# Patient Record
Sex: Female | Born: 1970 | Race: Black or African American | Hispanic: No | Marital: Single | Smoking: Never smoker
Health system: Southern US, Community
[De-identification: ages and names within clinical notes are randomized; demographics above are authoritative.]

## PROBLEM LIST (undated history)

## (undated) HISTORY — PX: TUBAL LIGATION: SHX77

---

## 2004-08-23 ENCOUNTER — Emergency Department: Payer: Self-pay | Admitting: Internal Medicine

## 2004-09-15 ENCOUNTER — Emergency Department: Payer: Self-pay | Admitting: Emergency Medicine

## 2004-10-23 ENCOUNTER — Emergency Department: Payer: Self-pay | Admitting: Emergency Medicine

## 2006-05-20 ENCOUNTER — Emergency Department: Payer: Self-pay | Admitting: Internal Medicine

## 2006-11-06 ENCOUNTER — Emergency Department: Payer: Self-pay | Admitting: Unknown Physician Specialty

## 2006-12-11 ENCOUNTER — Emergency Department: Payer: Self-pay | Admitting: Emergency Medicine

## 2007-06-29 ENCOUNTER — Emergency Department: Payer: Self-pay | Admitting: Emergency Medicine

## 2008-11-17 ENCOUNTER — Emergency Department: Payer: Self-pay | Admitting: Unknown Physician Specialty

## 2010-08-22 ENCOUNTER — Emergency Department: Payer: Self-pay | Admitting: Emergency Medicine

## 2011-02-28 ENCOUNTER — Emergency Department: Payer: Self-pay | Admitting: *Deleted

## 2013-12-03 ENCOUNTER — Emergency Department: Payer: Self-pay | Admitting: Emergency Medicine

## 2014-10-01 ENCOUNTER — Emergency Department: Payer: Self-pay | Admitting: Emergency Medicine

## 2015-02-07 IMAGING — CR DG WRIST COMPLETE 3+V*R*
1 series · 4 of 4 positions shown · non-contrast
Comparison: None.

CLINICAL DATA: Fall right wrist pain

EXAM:
RIGHT WRIST - COMPLETE 3+ VIEW

[Series 1: x wrist pa right · 0.14mm/px · 4 of 4 slices shown]
[im 1/4]
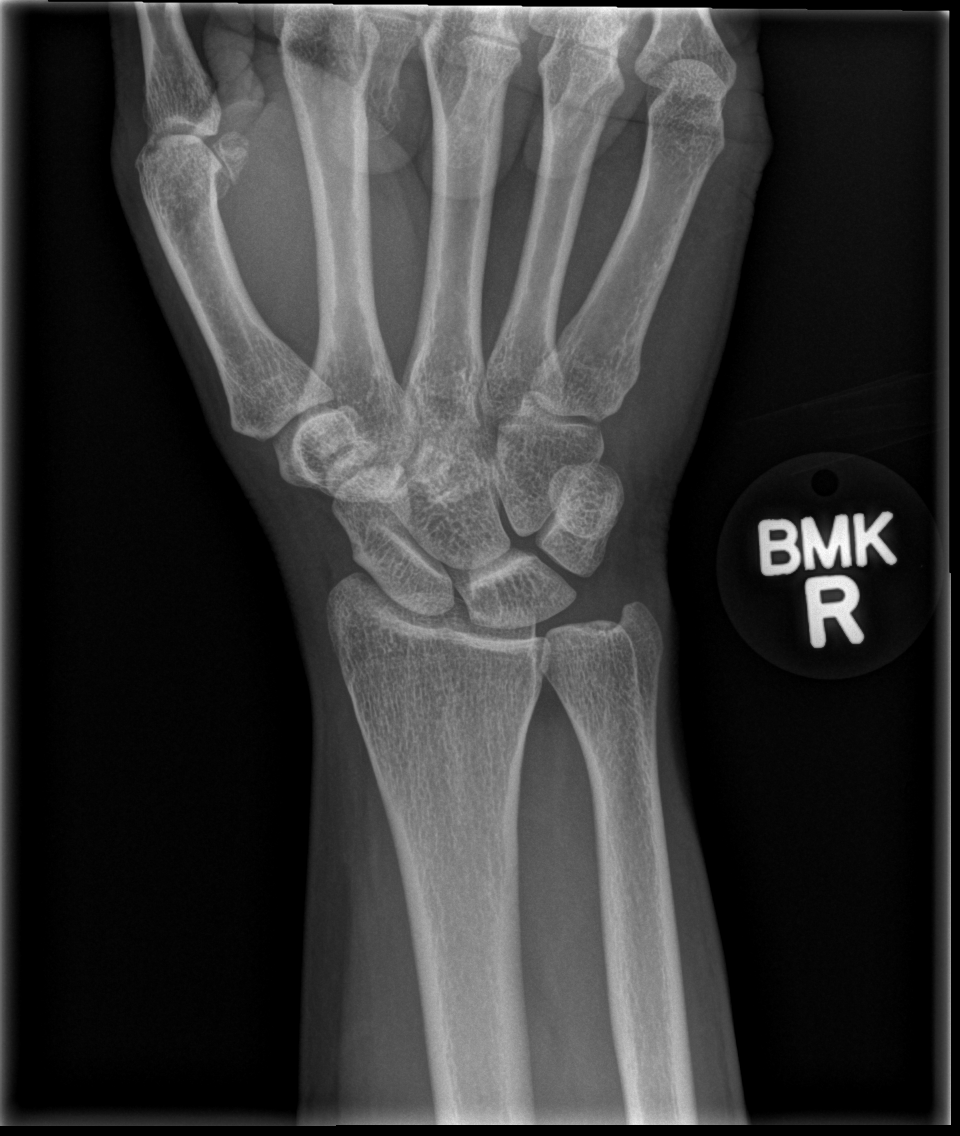
[im 2/4]
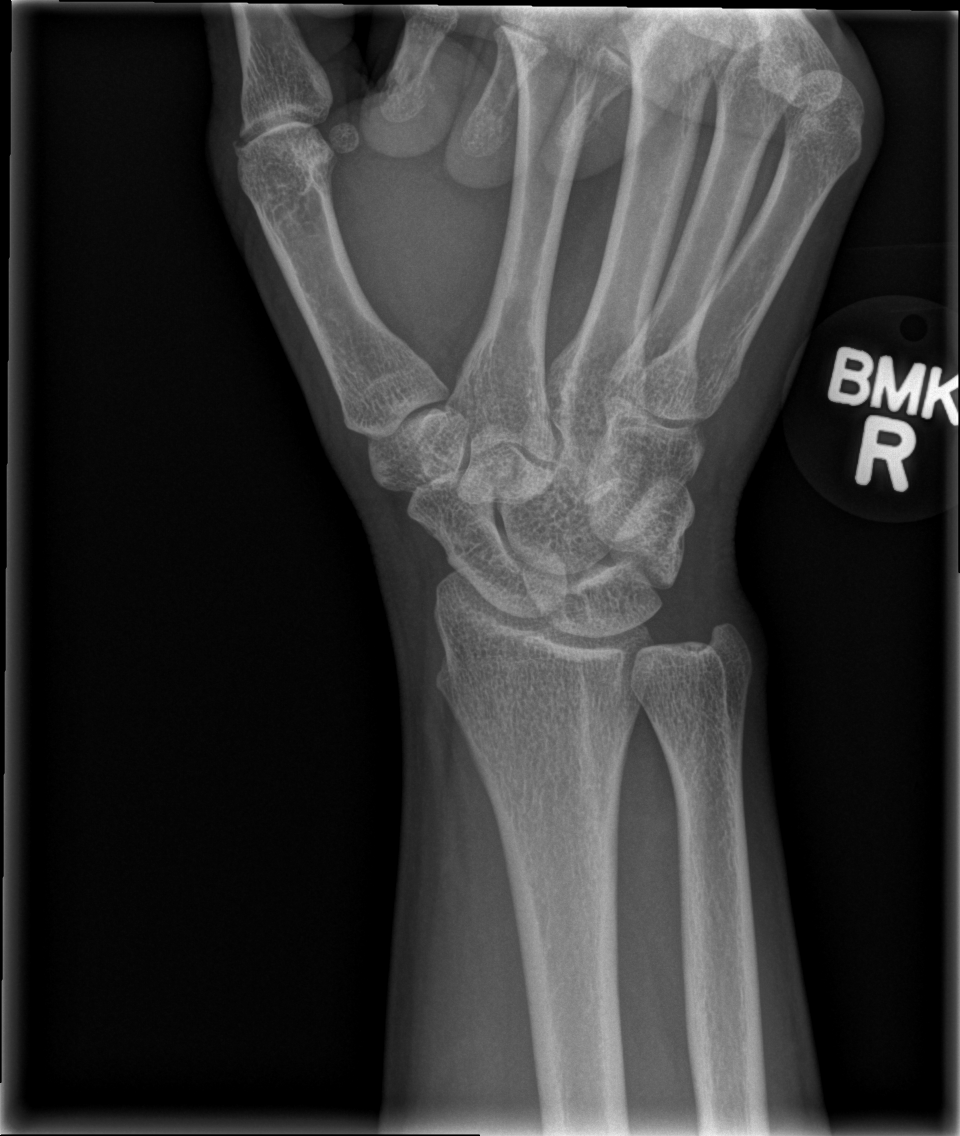
[im 3/4]
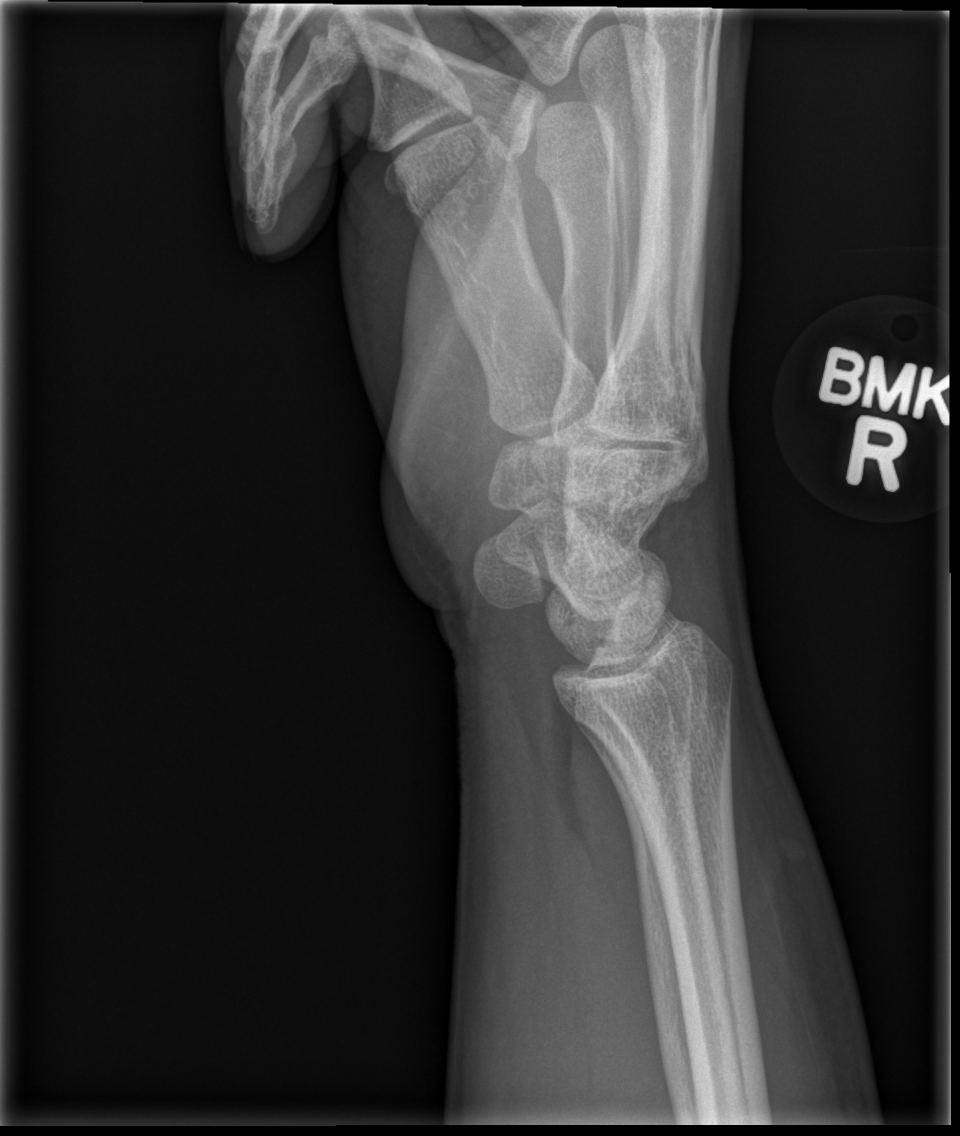
[im 4/4]
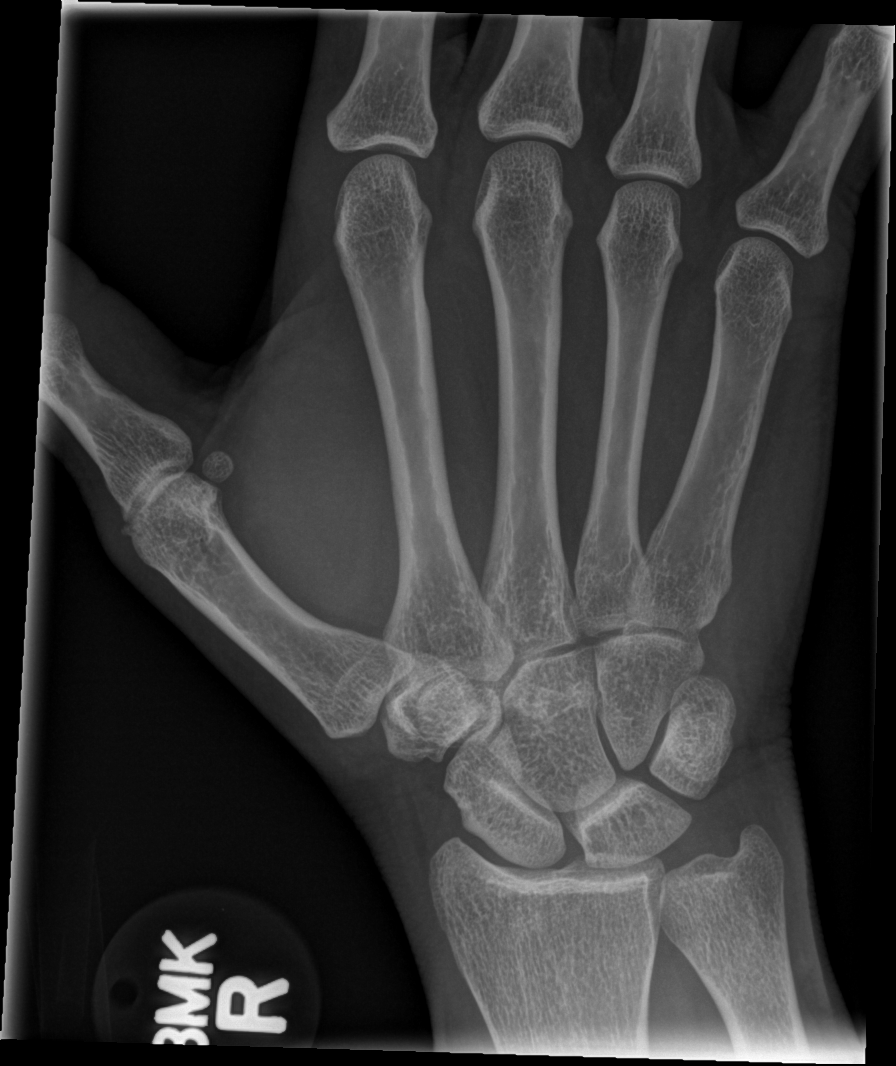

[4 of 4 positions shown; findings below may reference images not displayed]

FINDINGS: There is no fracture or dislocation. Incidental note is made of a
tiny cyst in the lunate. There is evidence of soft tissue swelling
along the volar surface of the wrist.
IMPRESSION: No acute abnormalities other than mild soft tissue swelling.

## 2016-01-28 ENCOUNTER — Emergency Department
Admission: EM | Admit: 2016-01-28 | Discharge: 2016-01-28 | Disposition: A | Discharge status: Home / Self Care | Payer: No Typology Code available for payment source | Attending: Emergency Medicine | Admitting: Emergency Medicine

## 2016-01-28 ENCOUNTER — Emergency Department: Payer: No Typology Code available for payment source

## 2016-01-28 ENCOUNTER — Encounter: Payer: Self-pay | Admitting: Emergency Medicine

## 2016-01-28 DIAGNOSIS — Y939 Activity, unspecified: Secondary | ICD-10-CM | POA: Insufficient documentation

## 2016-01-28 DIAGNOSIS — S199XXA Unspecified injury of neck, initial encounter: Secondary | ICD-10-CM | POA: Diagnosis present

## 2016-01-28 DIAGNOSIS — S93601A Unspecified sprain of right foot, initial encounter: Secondary | ICD-10-CM | POA: Insufficient documentation

## 2016-01-28 DIAGNOSIS — Y92410 Unspecified street and highway as the place of occurrence of the external cause: Secondary | ICD-10-CM | POA: Diagnosis not present

## 2016-01-28 DIAGNOSIS — Y999 Unspecified external cause status: Secondary | ICD-10-CM | POA: Diagnosis not present

## 2016-01-28 DIAGNOSIS — S134XXA Sprain of ligaments of cervical spine, initial encounter: Secondary | ICD-10-CM | POA: Insufficient documentation

## 2016-01-28 DIAGNOSIS — S139XXA Sprain of joints and ligaments of unspecified parts of neck, initial encounter: Secondary | ICD-10-CM

## 2016-01-28 MED ORDER — CYCLOBENZAPRINE HCL 10 MG PO TABS
10.0000 mg | ORAL_TABLET | Freq: Once | ORAL | Status: AC
  Administered 2016-01-28: 10 mg via ORAL
  Filled 2016-01-28: qty 1

## 2016-01-28 MED ORDER — TRAMADOL HCL 50 MG PO TABS
50.0000 mg | ORAL_TABLET | Freq: Once | ORAL | Status: AC
  Administered 2016-01-28: 50 mg via ORAL
  Filled 2016-01-28: qty 1

## 2016-01-28 MED ORDER — CYCLOBENZAPRINE HCL 10 MG PO TABS: 10.0000 mg | ORAL_TABLET | Freq: Three times a day (TID) | ORAL | Status: AC | PRN

## 2016-01-28 MED ORDER — TRAMADOL HCL 50 MG PO TABS: 50.0000 mg | ORAL_TABLET | Freq: Four times a day (QID) | ORAL | Status: AC | PRN
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
## 2016-10-24 ENCOUNTER — Encounter: Payer: Self-pay | Admitting: *Deleted

## 2016-10-24 ENCOUNTER — Emergency Department
Admission: EM | Admit: 2016-10-24 | Discharge: 2016-10-24 | Disposition: A | Discharge status: Home / Self Care | Payer: Self-pay | Attending: Emergency Medicine | Admitting: Emergency Medicine

## 2016-10-24 DIAGNOSIS — H1132 Conjunctival hemorrhage, left eye: Secondary | ICD-10-CM | POA: Insufficient documentation

## 2016-10-24 DIAGNOSIS — Z79899 Other long term (current) drug therapy: Secondary | ICD-10-CM | POA: Insufficient documentation

## 2016-10-24 NOTE — Discharge Instructions (Signed)
If any vision changes, pain, signs of increased bleeding return to the emergency department. Symptoms should resolve within 2 weeks.

## 2016-10-24 NOTE — ED Triage Notes (Signed)
Pt reports left eye pain.  Sx for 1 day.  No known injury to eye.  No changes in vision.   Pt alert   Speech clear.
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
## 2016-10-24 NOTE — ED Provider Notes (Signed)
[facility]-EMERGENCY DEPARTMENT Provider Note   CSN: [id number] Arrival date & time: 10/24/16  2112     History   Chief Complaint Chief Complaint  Patient presents with  . Eye Pain    HPI Madison Guerrero is a 46 y.o. female presents to the emergency department for evaluation of bleeding to the conjunctival area of the left eye. Patient's daughter noticed bleeding to the area around 1 PM today. Patient denies any trauma or injury. No significant coughing or sneezing episodes. She may have rubbed her eye but does not recall a specific incident. Patient denies any pain, vision changes or painful range of motion of the eye. No sensation of foreign body.  HPI  No past medical history on file.  There are no active problems to display for this patient.   No past surgical history on file.  OB History    No data available       Home Medications    Prior to Admission medications   Medication Sig Start Date End Date Taking? Authorizing Provider  cyclobenzaprine (FLEXERIL) 10 MG tablet Take 1 tablet (10 mg total) by mouth 3 (three) times daily as needed for muscle spasms. 01/28/16   Chinita Pester, FNP  traMADol (ULTRAM) 50 MG tablet Take 1 tablet (50 mg total) by mouth every 6 (six) hours as needed. 01/28/16   Chinita Pester, FNP    Family History No family history on file.  Social History Social History  Substance Use Topics  . Smoking status: Never Smoker  . Smokeless tobacco: Never Used  . Alcohol use No     Allergies   Patient has no known allergies.   Review of Systems Review of Systems  Constitutional: Negative for activity change, chills, fatigue and fever.  HENT: Negative for congestion and sinus pressure.   Eyes: Positive for redness. Negative for photophobia, pain, discharge and visual disturbance.  Gastrointestinal: Negative for constipation and vomiting.  Skin: Negative for rash.  Neurological: Negative for weakness and headaches.  Hematological: Negative for  adenopathy.  Psychiatric/Behavioral: Negative for agitation, behavioral problems and confusion.     Physical Exam Updated Vital Signs BP 133/84 (BP Location: Left Arm)   Pulse 69   Temp 98.5 F (36.9 C) (Oral)   Resp 18   Ht  (1.549 m)   Wt 62.6 kg   LMP 10/11/2016 (Approximate)   SpO2 98%   BMI 26.07 kg/m   Physical Exam  Constitutional: She appears well-developed and well-nourished. No distress.  HENT:  Head: Normocephalic and atraumatic.  Eyes: EOM are normal. Left eye exhibits no discharge, no exudate and no hordeolum. No foreign body present in the left eye. Right conjunctiva is not injected. Left conjunctiva is injected. Left eye exhibits normal extraocular motion.  Fundoscopic exam:      The left eye shows no exudate, no hemorrhage and no papilledema.  Slit lamp exam:      The left eye shows no foreign body, no hyphema and no anterior chamber bulge.  Positive subconjunctival hemorrhage to the superior conjunctival area, 1 cm in diameter. No increase in size since 1 PM today.  Neck: Neck supple.  Cardiovascular: Normal rate and regular rhythm.   Pulmonary/Chest: Effort normal. No respiratory distress.  Abdominal: There is no tenderness.  Musculoskeletal: She exhibits no edema.  Neurological: She is alert.  Skin: Skin is warm and dry.  Psychiatric: She has a normal mood and affect.  Nursing note and vitals reviewed.    ED Treatments /  Results  Labs (all labs ordered are listed, but only abnormal results are displayed) Labs Reviewed - No data to display  EKG  EKG Interpretation None       Radiology No results found.  Procedures Procedures (including critical care time)  Medications Ordered in ED Medications - No data to display   Initial Impression / Assessment and Plan / ED Course  I have reviewed the triage vital signs and the nursing notes.  Pertinent labs & imaging results that were available during my care of the patient were reviewed by  me and considered in my medical decision making (see chart for details).     46 year old female with left eye subconjunctival hemorrhage. She has no pain, vision changes or increase in bleeding since 1 PM today. Area of hemorrhage has remained the same size. No bleeding disorders, no blood thinners. Vital signs are within normal limits. She is educated on signs and symptoms return to the emergency department for. Follow-up with ophthalmologist if no improvement.  Final Clinical Impressions(s) / ED Diagnoses   Final diagnoses:  Subconjunctival hemorrhage, non-traumatic, left    New Prescriptions New Prescriptions   No medications on file     Evon Slack, PA-C 10/24/16 2316    Jene Every, MD 10/24/16 [phone]

## 2017-01-15 ENCOUNTER — Emergency Department: Payer: Self-pay

## 2017-01-15 ENCOUNTER — Encounter: Payer: Self-pay | Admitting: Emergency Medicine

## 2017-01-15 ENCOUNTER — Emergency Department
Admission: EM | Admit: 2017-01-15 | Discharge: 2017-01-15 | Disposition: A | Discharge status: Home / Self Care | Payer: Self-pay | Attending: Emergency Medicine | Admitting: Emergency Medicine

## 2017-01-15 DIAGNOSIS — X501XXA Overexertion from prolonged static or awkward postures, initial encounter: Secondary | ICD-10-CM | POA: Insufficient documentation

## 2017-01-15 DIAGNOSIS — S93492A Sprain of other ligament of left ankle, initial encounter: Secondary | ICD-10-CM | POA: Insufficient documentation

## 2017-01-15 DIAGNOSIS — Y929 Unspecified place or not applicable: Secondary | ICD-10-CM | POA: Insufficient documentation

## 2017-01-15 DIAGNOSIS — Y939 Activity, unspecified: Secondary | ICD-10-CM | POA: Insufficient documentation

## 2017-01-15 DIAGNOSIS — Y999 Unspecified external cause status: Secondary | ICD-10-CM | POA: Insufficient documentation

## 2017-01-15 MED ORDER — MELOXICAM 15 MG PO TABS: 15.0000 mg | ORAL_TABLET | Freq: Every day | ORAL | 0 refills | Status: AC

## 2017-01-15 NOTE — ED Provider Notes (Signed)
[facility] Emergency Department Provider Note  ____________________________________________  Time seen: Approximately 7:15 PM  I have reviewed the triage vital signs and the nursing notes.   HISTORY  Chief Complaint Foot Injury    HPI Madison Guerrero is a 46 y.o. female who presents to emergency department complaining of left foot pain. Patient reports that she had a "twisting" injury to the left foot/ankle approximately 2 weeks ago. Patient reports that she has had continued pain and swelling to the proximal second through fourth metatarsals since injury. Patient has been ambulatory on her foot since the injury. No other injury or complaint. Patient has had intermittent use of over-the-counter medications with some improvement of symptoms. No history of previous ankle injury/surgery.   History reviewed. No pertinent past medical history.  There are no active problems to display for this patient.   Past Surgical History:  Procedure Laterality Date  . TUBAL LIGATION      Prior to Admission medications   Medication Sig Start Date End Date Taking? Authorizing Provider  cyclobenzaprine (FLEXERIL) 10 MG tablet Take 1 tablet (10 mg total) by mouth 3 (three) times daily as needed for muscle spasms. 01/28/16   Triplett, Rulon Eisenmenger B, FNP  meloxicam (MOBIC) 15 MG tablet Take 1 tablet (15 mg total) by mouth daily. 01/15/17   Cuthriell, Delorise Royals, PA-C  traMADol (ULTRAM) 50 MG tablet Take 1 tablet (50 mg total) by mouth every 6 (six) hours as needed. 01/28/16   Chinita Pester, FNP    Allergies Patient has no known allergies.  No family history on file.  Social History Social History  Substance Use Topics  . Smoking status: Never Smoker  . Smokeless tobacco: Never Used  . Alcohol use No     Review of Systems  Constitutional: No fever/chills Eyes: No visual changes.  Cardiovascular: no chest pain. Respiratory: no cough. No SOB. Gastrointestinal: No abdominal  pain.  No nausea, no vomiting.  Musculoskeletal: Positive for 2 week history of left foot pain Skin: Negative for rash, abrasions, lacerations, ecchymosis. Neurological: Negative for headaches, focal weakness or numbness. 10-point ROS otherwise negative.  ____________________________________________   PHYSICAL EXAM:  VITAL SIGNS: ED Triage Vitals [01/15/17 1854]  Enc Vitals Group     BP 136/86     Pulse Rate 99     Resp 16     Temp 98.1 F (36.7 C)     Temp Source Oral     SpO2 99 %     Weight 138 lb (62.6 kg)     Height 5\' 1"  (1.549 m)     Head Circumference      Peak Flow      Pain Score 9     Pain Loc      Pain Edu?      Excl. in GC?      Constitutional: Alert and oriented. Well appearing and in no acute distress. Eyes: Conjunctivae are normal. PERRL. EOMI. Head: Atraumatic. Neck: No stridor.    Cardiovascular: Normal rate, regular rhythm. Normal S1 and S2.  Good peripheral circulation. Respiratory: Normal respiratory effort without tachypnea or retractions. Lungs CTAB. Good air entry to the bases with no decreased or absent breath sounds. Musculoskeletal: Full range of motion to all extremities. No gross deformities appreciated.Mild edema noted for foot the left foot. No ecchymosis. Full range of motion to the left ankle. Patient is nontender to palpation over the ankle joint. She is tender to palpation over the proximal second through fourth metatarsals.  No palpable abnormality. Full range of motion all digits left foot. Sensation intact all digits left foot. Cap refill intact all 5 digits left foot. Dorsalis pedis pulse intact. Neurologic:  Normal speech and language. No gross focal neurologic deficits are appreciated.  Skin:  Skin is warm, dry and intact. No rash noted. Psychiatric: Mood and affect are normal. Speech and behavior are normal. Patient exhibits appropriate insight and judgement.   ____________________________________________   LABS (all labs ordered  are listed, but only abnormal results are displayed)  Labs Reviewed - No data to display ____________________________________________  EKG   ____________________________________________  RADIOLOGY Festus BarrenI, Jonathan D Cuthriell, personally viewed and evaluated these images (plain radiographs) as part of my medical decision making, as well as reviewing the written report by the radiologist.  Dg Foot Complete Left  Result Date: 01/15/2017 CLINICAL DATA:  Left foot pain following twisting injury, initial encounter EXAM: LEFT FOOT - COMPLETE 3+ VIEW COMPARISON:  None. FINDINGS: There is no evidence of fracture or dislocation. There is no evidence of arthropathy or other focal bone abnormality. Soft tissues are unremarkable. IMPRESSION: No acute abnormality noted. Electronically Signed   By: Alcide CleverMark  Lukens M.D.   On: 01/15/2017 19:40    ____________________________________________    PROCEDURES  Procedure(s) performed:    Procedures    Medications - No data to display   ____________________________________________   INITIAL IMPRESSION / ASSESSMENT AND PLAN / ED COURSE  Pertinent labs & imaging results that were available during my care of the patient were reviewed by me and considered in my medical decision making (see chart for details).  Review of the [place] CSRS was performed in accordance of the [facility] prior to dispensing any controlled drugs.     Patient's diagnosis is consistent with left ankle sprain. X-ray reveals no acute osseous abnormality. Exam is reassuring. Patient has a neoprene brace that she is using at home.. Patient will be discharged home with prescriptions for anti-inflammatories for symptom control. Patient is to follow up with podiatry as needed or otherwise directed. Patient is given ED precautions to return to the ED for any worsening or new symptoms.     ____________________________________________  FINAL CLINICAL IMPRESSION(S) / ED DIAGNOSES  Final  diagnoses:  Sprain of anterior talofibular ligament of left ankle, initial encounter      NEW MEDICATIONS STARTED DURING THIS VISIT:  New Prescriptions   MELOXICAM (MOBIC) 15 MG TABLET    Take 1 tablet (15 mg total) by mouth daily.        This chart was dictated using voice recognition software/Dragon. Despite best efforts to proofread, errors can occur which can change the meaning. Any change was purely unintentional.    Lanette HampshireCuthriell, Jonathan D, PA-C 01/15/17 2004    Sharman CheekStafford, Phillip, MD 01/16/17 0120
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
## 2019-10-21 ENCOUNTER — Ambulatory Visit: Payer: Self-pay | Attending: Internal Medicine

## 2019-10-21 DIAGNOSIS — Z23 Encounter for immunization: Secondary | ICD-10-CM

## 2019-10-21 NOTE — Progress Notes (Signed)
   Covid-19 Vaccination Clinic  Name:  Madison Guerrero    MRN: [id number] DOB: January 09, 1971  10/21/2019  [name] was observed post Covid-19 immunization for 15 minutes without incident. She was provided with Vaccine Information Sheet and instruction to access the V-Safe system.   [name] was instructed to call 911 with any severe reactions post vaccine: Marland Kitchen Difficulty breathing  . Swelling of face and throat  . A fast heartbeat  . A bad rash all over body  . Dizziness and weakness   Immunizations Administered    Name Date Dose VIS Date Route   Pfizer COVID-19 Vaccine 10/21/2019  1:58 PM 0.3 mL 07/05/2019 Intramuscular   Manufacturer: [employer], [employer]   Lot: [id number]   NDC: [address]-1

## 2019-10-28 ENCOUNTER — Ambulatory Visit: Payer: Self-pay

## 2019-11-12 ENCOUNTER — Ambulatory Visit: Payer: Self-pay | Attending: Internal Medicine

## 2019-11-12 DIAGNOSIS — Z23 Encounter for immunization: Secondary | ICD-10-CM

## 2019-11-12 NOTE — Progress Notes (Signed)
   Covid-19 Vaccination Clinic  Name:  Madison Guerrero    MRN: [id number] DOB: 1970-12-10  11/12/2019  [name] was observed post Covid-19 immunization for 15 minutes without incident. She was provided with Vaccine Information Sheet and instruction to access the V-Safe system.   [name] was instructed to call 911 with any severe reactions post vaccine: Marland Kitchen Difficulty breathing  . Swelling of face and throat  . A fast heartbeat  . A bad rash all over body  . Dizziness and weakness   Immunizations Administered    Name Date Dose VIS Date Route   Pfizer COVID-19 Vaccine 11/12/2019  3:21 PM 0.3 mL 09/18/2018 Intramuscular   Manufacturer: [employer], [employer]   Lot: [id number]   NDC: [address]-0

## 2021-04-10 ENCOUNTER — Encounter: Payer: Self-pay | Admitting: Emergency Medicine

## 2021-04-10 ENCOUNTER — Other Ambulatory Visit: Payer: Self-pay

## 2021-04-10 ENCOUNTER — Emergency Department
Admission: EM | Admit: 2021-04-10 | Discharge: 2021-04-10 | Disposition: A | Discharge status: Home / Self Care | Payer: BLUE CROSS/BLUE SHIELD | Attending: Student in an Organized Health Care Education/Training Program | Admitting: Student in an Organized Health Care Education/Training Program

## 2021-04-10 DIAGNOSIS — R21 Rash and other nonspecific skin eruption: Secondary | ICD-10-CM | POA: Diagnosis not present

## 2021-04-10 MED ORDER — ALCLOMETASONE DIPROPIONATE 0.05 % EX CREA: TOPICAL_CREAM | Freq: Two times a day (BID) | CUTANEOUS | 0 refills | Status: AC

## 2021-04-10 NOTE — ED Triage Notes (Signed)
Pt via POV from home. States that she has been have a itchy rash all over her body for the past 10 days. Pt states that she went to the [facility] and they prescribed her doxycycline and prednisone with no relief. Pt states that it got worse. Pt is A&Ox4 and NAD.

## 2021-04-10 NOTE — ED Notes (Signed)
Pt with red raised discrete rash to bilateral forearms that is pruitic. Pt states has been present for several days, states she was seen and prescribed medication for same earlier without relief.

## 2021-04-10 NOTE — ED Provider Notes (Signed)
[facility] Emergency Department Provider Note    Event Date/Time   First MD Initiated Contact with Patient 04/10/21 1944     (approximate)  I have reviewed the triage vital signs and the nursing notes.   HISTORY  Chief Complaint Rash    HPI Madison Guerrero is a 50 y.o. female with rash to bilateral extremities as well as trunk.  Is been going on for several days.  She works in Audiological [employer].  Uncertain as to whether she has had any recent exposures states that she did start a new detergent and has never had any history of eczema.  Was prescribed doxycycline as well as prednisone without any improvement has tried topical medications without improvement.  Has not seen a dermatologist.  Denies any fevers.  No mucosal lesions.  History reviewed. No pertinent past medical history. History reviewed. No pertinent family history. Past Surgical History:  Procedure Laterality Date   TUBAL LIGATION     There are no problems to display for this patient.     Prior to Admission medications   Medication Sig Start Date End Date Taking? Authorizing Provider  alclomethasone (ACLOVATE) 0.05 % cream Apply topically 2 (two) times daily for 6 days. 04/10/21 04/16/21 Yes Willy Eddy, MD  cyclobenzaprine (FLEXERIL) 10 MG tablet Take 1 tablet (10 mg total) by mouth 3 (three) times daily as needed for muscle spasms. 01/28/16   Triplett, Rulon Eisenmenger B, FNP  meloxicam (MOBIC) 15 MG tablet Take 1 tablet (15 mg total) by mouth daily. 01/15/17   Cuthriell, Delorise Royals, PA-C  traMADol (ULTRAM) 50 MG tablet Take 1 tablet (50 mg total) by mouth every 6 (six) hours as needed. 01/28/16   Chinita Pester, FNP    Allergies Patient has no known allergies.    Social History Social History   Tobacco Use   Smoking status: Never   Smokeless tobacco: Never  Substance Use Topics   Alcohol use: No   Drug use: No    Review of Systems Patient denies headaches, rhinorrhea, blurry vision, numbness,  shortness of breath, chest pain, edema, cough, abdominal pain, nausea, vomiting, diarrhea, dysuria, fevers, rashes or hallucinations unless otherwise stated above in HPI. ____________________________________________   PHYSICAL EXAM:  VITAL SIGNS: Vitals:   04/10/21 1812  BP: (!) 146/98  Pulse: (!) 109  Resp: 20  Temp: 98.1 F (36.7 C)  SpO2: 97%    Constitutional: Alert and oriented.  Eyes: Conjunctivae are normal.  Head: Atraumatic. Nose: No congestion/rhinnorhea. Mouth/Throat: Mucous membranes are moist.   Neck: No stridor. Painless ROM.  Cardiovascular: Normal rate, regular rhythm. Grossly normal heart sounds.  Good peripheral circulation. Respiratory: Normal respiratory effort.  No retractions. Lungs CTAB. Gastrointestinal: Soft and nontender. No distention. No abdominal bruits. No CVA tenderness. Genitourinary:  Musculoskeletal: No lower extremity tenderness nor edema.  No joint effusions. Neurologic:  Normal speech and language. No gross focal neurologic deficits are appreciated. No facial droop Skin:  Skin is warm, dry and intact.  Scattered eruption maculopapular rash and the flexor surfaces of bilateral upper extremities as well as her trunk.  Negative Nikolsky sign.  No surrounding erythema.  Psychiatric: Mood and affect are normal. Speech and behavior are normal.  ____________________________________________   LABS (all labs ordered are listed, but only abnormal results are displayed)  No results found for this or any previous visit (from the past 24 hour(s)). ____________________________________________ ____________________________________________  RADIOLOGY   ____________________________________________   PROCEDURES  Procedure(s) performed:  Procedures    Critical  Care performed: no ____________________________________________   INITIAL IMPRESSION / ASSESSMENT AND PLAN / ED COURSE  Pertinent labs & imaging results that were available during my  care of the patient were reviewed by me and considered in my medical decision making (see chart for details).   DDX: eczema, urticaria, cellulitis, bug bites  Madison Guerrero is a 50 y.o. who presents to the ED with symptoms as described above.  Appears eczematous in nature, no overlying infection or cellulitic changes.  We will put on topical steroid.  Will give referral to dermatology.     The patient was evaluated in Emergency Department today for the symptoms described in the history of present illness. He/she was evaluated in the context of the global COVID-19 pandemic, which necessitated consideration that the patient might be at risk for infection with the SARS-CoV-2 virus that causes COVID-19. Institutional protocols and algorithms that pertain to the evaluation of patients at risk for COVID-19 are in a state of rapid change based on information released by regulatory bodies including the CDC and federal and state organizations. These policies and algorithms were followed during the patient's care in the ED.  As part of my medical decision making, I reviewed the following data within the electronic MEDICAL RECORD NUMBER Nursing notes reviewed and incorporated, Labs reviewed, notes from prior ED visits and [place] Controlled Substance Database   ____________________________________________   FINAL CLINICAL IMPRESSION(S) / ED DIAGNOSES  Final diagnoses:  Rash      NEW MEDICATIONS STARTED DURING THIS VISIT:  New Prescriptions   ALCLOMETHASONE (ACLOVATE) 0.05 % CREAM    Apply topically 2 (two) times daily for 6 days.     Note:  This document was prepared using Dragon voice recognition software and may include unintentional dictation errors.    Willy Eddy, MD 04/10/21 2001

## 2021-11-07 ENCOUNTER — Emergency Department
Admission: EM | Admit: 2021-11-07 | Discharge: 2021-11-07 | Disposition: A | Discharge status: Home / Self Care | Payer: BLUE CROSS/BLUE SHIELD | Attending: Emergency Medicine | Admitting: Emergency Medicine

## 2021-11-07 ENCOUNTER — Emergency Department: Payer: BLUE CROSS/BLUE SHIELD

## 2021-11-07 ENCOUNTER — Other Ambulatory Visit: Payer: Self-pay

## 2021-11-07 DIAGNOSIS — N200 Calculus of kidney: Secondary | ICD-10-CM

## 2021-11-07 DIAGNOSIS — R630 Anorexia: Secondary | ICD-10-CM | POA: Insufficient documentation

## 2021-11-07 DIAGNOSIS — R109 Unspecified abdominal pain: Secondary | ICD-10-CM | POA: Diagnosis present

## 2021-11-07 DIAGNOSIS — N132 Hydronephrosis with renal and ureteral calculous obstruction: Secondary | ICD-10-CM | POA: Insufficient documentation

## 2021-11-07 DIAGNOSIS — R197 Diarrhea, unspecified: Secondary | ICD-10-CM | POA: Insufficient documentation

## 2021-11-07 LAB — COMPREHENSIVE METABOLIC PANEL
ALT: 28 U/L (ref 0–44)
AST: 23 U/L (ref 15–41)
Albumin: 4 g/dL (ref 3.5–5.0)
Alkaline Phosphatase: 95 U/L (ref 38–126)
Anion gap: 9 (ref 5–15)
BUN: 11 mg/dL (ref 6–20)
CO2: 23 mmol/L (ref 22–32)
Calcium: 9 mg/dL (ref 8.9–10.3)
Chloride: 107 mmol/L (ref 98–111)
Creatinine, Ser: 0.84 mg/dL (ref 0.44–1.00)
GFR, Estimated: >60 mL/min (ref >60)
Glucose, Bld: 109 mg/dL — ABNORMAL HIGH (ref 70–99)
Potassium: 3.6 mmol/L (ref 3.5–5.1)
Sodium: 139 mmol/L (ref 135–145)
Total Bilirubin: 1 mg/dL (ref 0.3–1.2)
Total Protein: 7.6 g/dL (ref 6.5–8.1)

## 2021-11-07 LAB — URINALYSIS, ROUTINE W REFLEX MICROSCOPIC
Bacteria, UA: NONE SEEN
Bilirubin Urine: NEGATIVE
Glucose, UA: NEGATIVE mg/dL
Ketones, ur: NEGATIVE mg/dL
Nitrite: NEGATIVE
Protein, ur: NEGATIVE mg/dL
Specific Gravity, Urine: 1.012 (ref 1.005–1.030)
pH: 6 (ref 5.0–8.0)

## 2021-11-07 LAB — CBC
HCT: 41.4 % (ref 36.0–46.0)
Hemoglobin: 13.5 g/dL (ref 12.0–15.0)
MCH: 30.4 pg (ref 26.0–34.0)
MCHC: 32.6 g/dL (ref 30.0–36.0)
MCV: 93.2 fL (ref 80.0–100.0)
Platelets: 306 10*3/uL (ref 150–400)
RBC: 4.44 MIL/uL (ref 3.87–5.11)
RDW: 13.2 % (ref 11.5–15.5)
WBC: 10.8 10*3/uL — ABNORMAL HIGH (ref 4.0–10.5)
nRBC: 0 % (ref 0.0–0.2)

## 2021-11-07 LAB — LIPASE, BLOOD: Lipase: 31 U/L (ref 11–51)

## 2021-11-07 MED ORDER — KETOROLAC TROMETHAMINE 30 MG/ML IJ SOLN
15.0000 mg | Freq: Once | INTRAMUSCULAR | Status: AC
  Administered 2021-11-07: 15 mg via INTRAVENOUS
  Filled 2021-11-07: qty 1

## 2021-11-07 MED ORDER — TAMSULOSIN HCL 0.4 MG PO CAPS: 0.4000 mg | ORAL_CAPSULE | Freq: Every day | ORAL | 0 refills | Status: AC

## 2021-11-07 MED ORDER — KETOROLAC TROMETHAMINE 10 MG PO TABS: 10.0000 mg | ORAL_TABLET | Freq: Three times a day (TID) | ORAL | 0 refills | Status: AC | PRN

## 2021-11-07 MED ORDER — ONDANSETRON HCL 4 MG PO TABS: 4.0000 mg | ORAL_TABLET | Freq: Three times a day (TID) | ORAL | 0 refills | Status: AC | PRN

## 2021-11-07 MED ORDER — IOHEXOL 300 MG/ML  SOLN
100.0000 mL | Freq: Once | INTRAMUSCULAR | Status: AC | PRN
  Administered 2021-11-07: 100 mL via INTRAVENOUS

## 2021-11-07 MED ORDER — CEPHALEXIN 500 MG PO CAPS: 500.0000 mg | ORAL_CAPSULE | Freq: Three times a day (TID) | ORAL | 0 refills | Status: AC

## 2021-11-07 NOTE — ED Triage Notes (Signed)
Pt comes pov with right sided pain for about a week. Nephew was sick recently. Pt onlyhad x1 episode of emesis.  ?

## 2021-11-07 NOTE — Discharge Instructions (Addendum)
Please be sure to return for any fevers, worsening pain, persistent vomiting or any other new or concerning symptoms.  ?

## 2021-11-07 NOTE — ED Provider Notes (Signed)
? ?[facility] ?Provider Note ? ? ? Event Date/Time  ? First MD Initiated Contact with Patient 11/07/21 1626   ?  (approximate) ? ? ?History  ? ?Abdominal Pain ? ? ?HPI ? ?Madison Guerrero is a 51 y.o. female  who presents to the emergency department today because of concerns for abdominal pain.  Located in her right side of her abdomen.  It has been present for almost 1 week.  Has been fairly constant.  It is worse when the patient lies flat.  She states it has been associated with decreased appetite.  She did have some diarrhea and nausea with 1 episode of vomiting.  She denies any fevers.  States she was exposed to a family member who has had a couple viral illnesses.  She denies similar pain in the past. ? ?Physical Exam  ? ?Triage Vital Signs: ?ED Triage Vitals  ?Enc Vitals Group  ?   BP 11/07/21 1522 (!) 137/97  ?   Pulse Rate 11/07/21 1522 100  ?   Resp 11/07/21 1522 18  ?   Temp 11/07/21 1522 98.4 ?F (36.9 ?C)  ?   Temp Source 11/07/21 1522 Oral  ?   SpO2 11/07/21 1522 95 %  ?   Weight 11/07/21 1521 140 lb (63.5 kg)  ?   Height --   ?   Head Circumference --   ?   Peak Flow --   ?   Pain Score 11/07/21 1521 9  ? ?Most recent vital signs: ?Vitals:  ? 11/07/21 1522  ?BP: (!) 137/97  ?Pulse: 100  ?Resp: 18  ?Temp: 98.4 ?F (36.9 ?C)  ?SpO2: 95%  ? ? ?General: Awake, alert and oriented. ?CV:  Good peripheral perfusion. Regular rate and rhythm. ?Resp:  Normal effort. Lungs clear to auscultation ?Abd:  No distention. Non tender. ? ? ?ED Results / Procedures / Treatments  ? ?Labs ?(all labs ordered are listed, but only abnormal results are displayed) ?Labs Reviewed  ?COMPREHENSIVE METABOLIC PANEL - Abnormal; Notable for the following components:  ?    Result Value  ? Glucose, Bld 109 (*)   ? All other components within normal limits  ?CBC - Abnormal; Notable for the following components:  ? WBC 10.8 (*)   ? All other components within normal limits  ?URINALYSIS, ROUTINE W REFLEX MICROSCOPIC -  Abnormal; Notable for the following components:  ? Color, Urine YELLOW (*)   ? APPearance HAZY (*)   ? Hgb urine dipstick SMALL (*)   ? Leukocytes,Ua LARGE (*)   ? All other components within normal limits  ?LIPASE, BLOOD  ? ? ? ?EKG ? ?None ? ? ?RADIOLOGY ?I independently interpreted and visualized the CT ab/pel. My interpretation: no free air ?Radiology interpretation:  ?IMPRESSION:  ?1. 5 mm obstructing calculus in the mid right ureter. Moderate right  ?hydroureteronephrosis.  ?2. Colonic diverticulosis.  ?3. Cholelithiasis.  ?4. Fibroid uterus.  ? ? ? ?PROCEDURES: ? ?Critical Care performed: No ? ?Procedures ? ? ?MEDICATIONS ORDERED IN ED: ?Medications - No data to display ? ? ?IMPRESSION / MDM / ASSESSMENT AND PLAN / ED COURSE  ?I reviewed the triage vital signs and the nursing notes. ?             ?               ? ?Differential diagnosis includes, but is not limited to, urinary tract infection, appendicitis, gallbladder disease, kidney stone. ? ?Patient presents to the emergency  department today because of concerns for right-sided abdominal pain.  On exam patient is without any tenderness.  No leukocytosis in the serum and patient is afebrile however given location of pain did obtain CT scan.  CT scan was consistent with a right sided ureteral stone.  I do think this would explain the patient's symptoms.  This time I have very low suspicion for infection given lack of leukocytosis and blood or fever.  Did have some leukocytes and white blood cells in the urine however this could simply be due to the kidney stone.  I discussed findings with patient.  Will plan on discharging on medication including antibiotics out of abundance of caution.  Will give patient urology follow-up information. ? ?FINAL CLINICAL IMPRESSION(S) / ED DIAGNOSES  ? ?Final diagnoses:  ?Kidney stone  ? ? ? ? ?Note:  This document was prepared using Dragon voice recognition software and may include unintentional dictation errors. ? ?   ?Phineas Semen, MD ?11/07/21 1755 ? ?

## 2022-04-05 ENCOUNTER — Ambulatory Visit (LOCAL_COMMUNITY_HEALTH_CENTER): Payer: Self-pay

## 2022-04-05 DIAGNOSIS — Z111 Encounter for screening for respiratory tuberculosis: Secondary | ICD-10-CM

## 2022-04-08 ENCOUNTER — Ambulatory Visit (LOCAL_COMMUNITY_HEALTH_CENTER): Payer: Self-pay

## 2022-04-08 DIAGNOSIS — Z111 Encounter for screening for respiratory tuberculosis: Secondary | ICD-10-CM

## 2022-04-08 LAB — TB SKIN TEST
Induration: 0 mm
TB Skin Test: NEGATIVE

## 2023-01-12 IMAGING — CT CT ABD-PELV W/ CM
2 of 5 series · 16 of 46 positions shown, 18 images · IV contrast (APPLIED)
Comparison: None.

CLINICAL DATA: Right lower quadrant pain

EXAM:
CT ABDOMEN AND PELVIS WITH CONTRAST
TECHNIQUE: Multidetector CT imaging of the abdomen and pelvis was performed
using the standard protocol following bolus administration of
intravenous contrast.

[Series 2: abdomen 5.0 · axial · 0.79mm/px · z∈[-834,-414]mm · 13 of 98 slices shown, 15 images]
[im 7/98  soft-tissue]
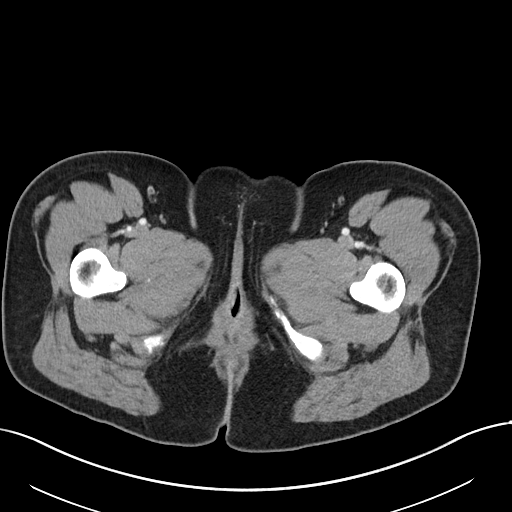
[im 7/98  bone]
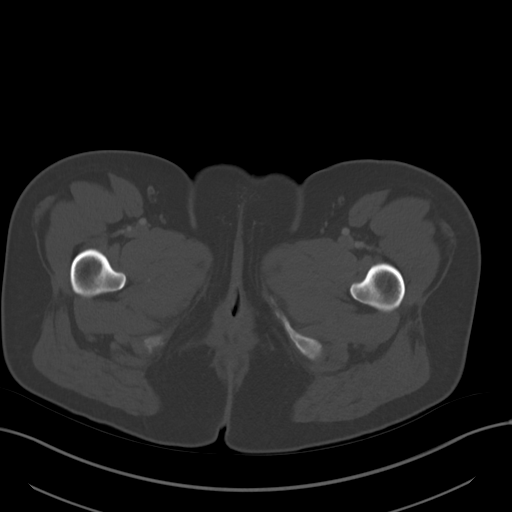
[im 13/98  soft-tissue]
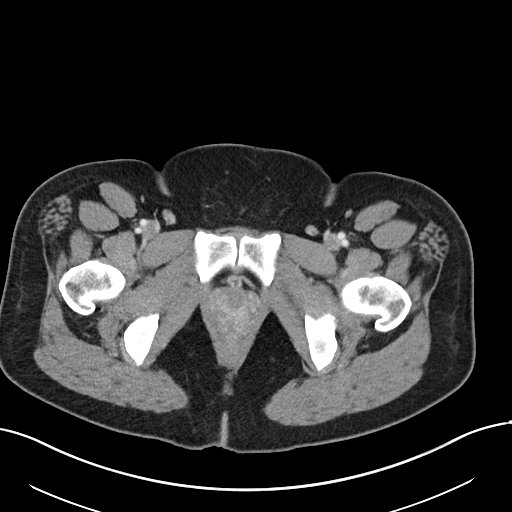
[im 20/98  soft-tissue]
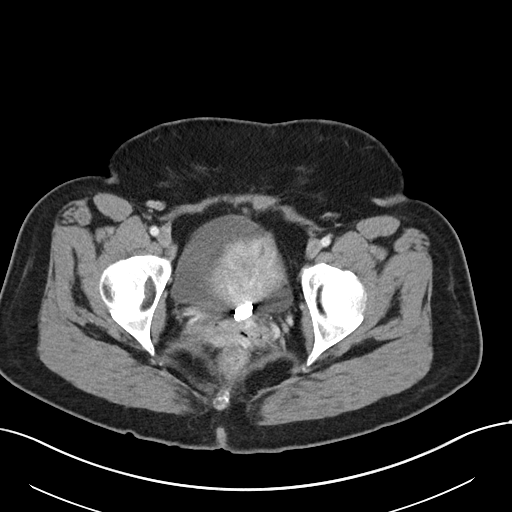
[im 26/98  soft-tissue]
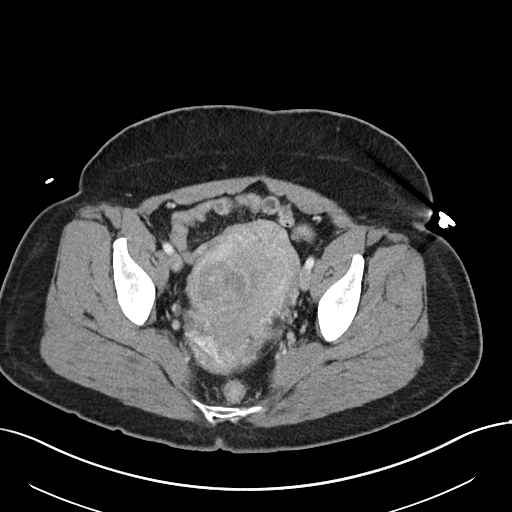
[im 33/98  soft-tissue]
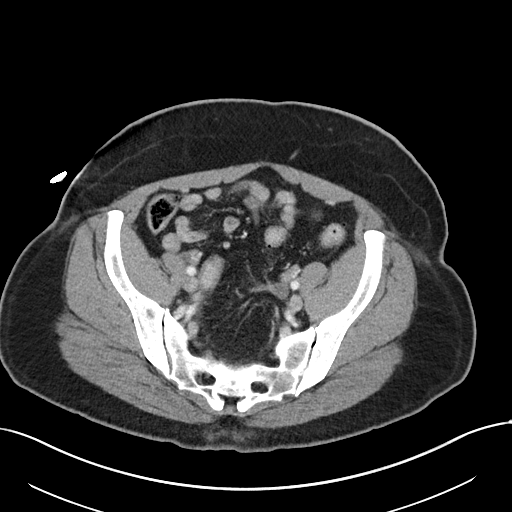
[im 39/98  soft-tissue]
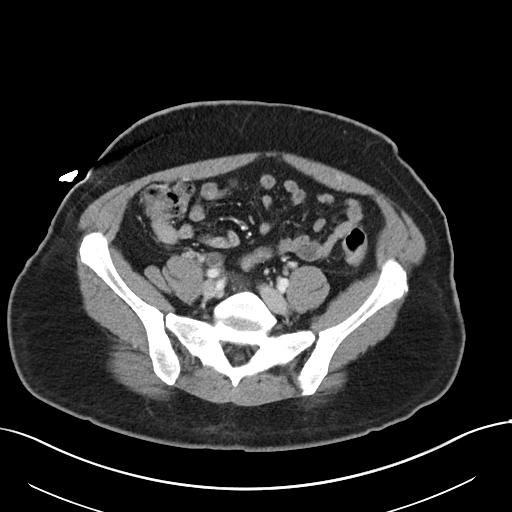
[im 52/98  soft-tissue]
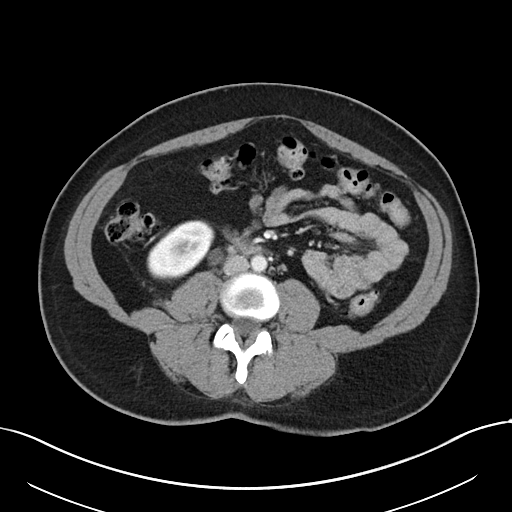
[im 59/98  soft-tissue]
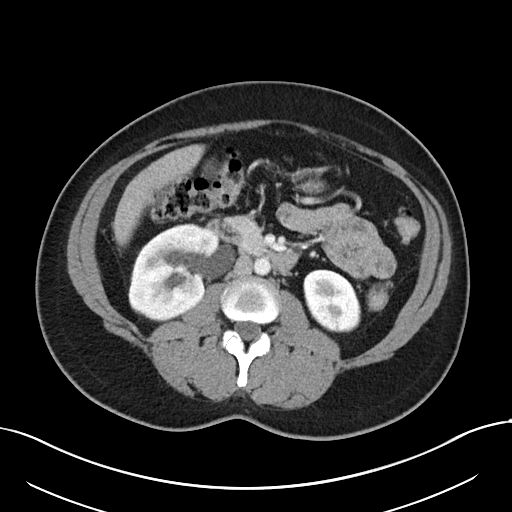
[im 65/98  soft-tissue]
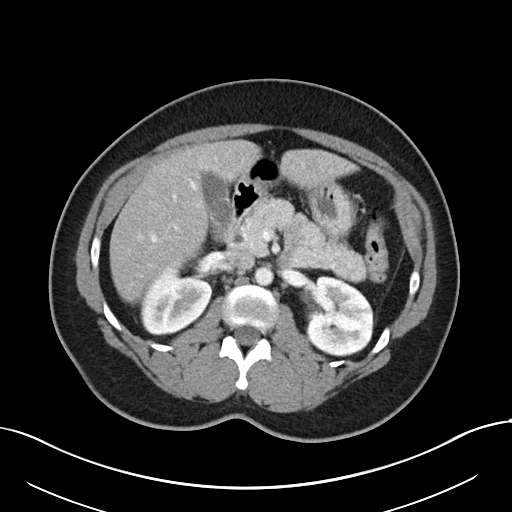
[im 65/98  bone]
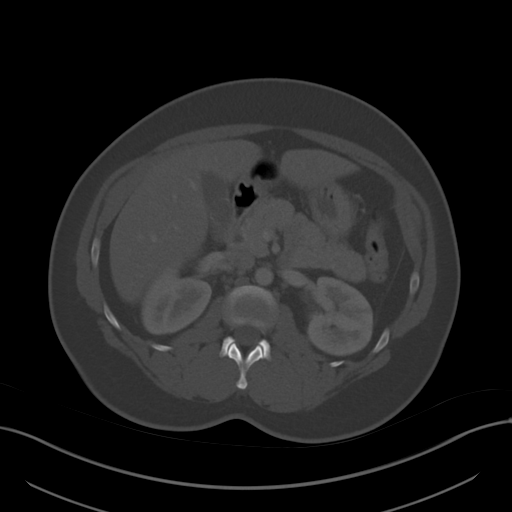
[im 72/98  soft-tissue]
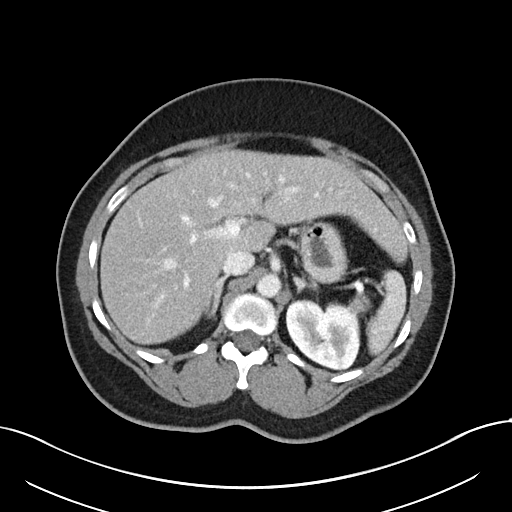
[im 78/98  soft-tissue]
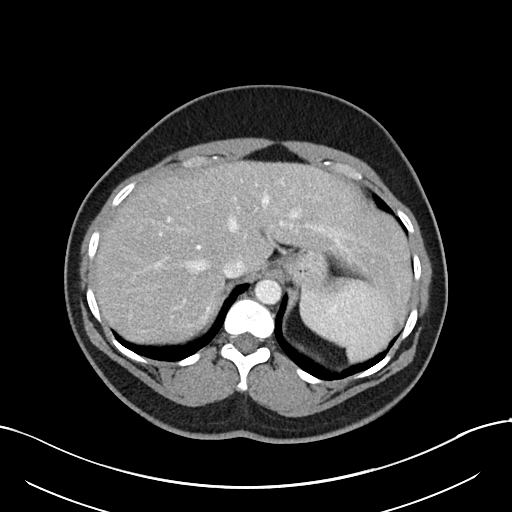
[im 85/98  soft-tissue]
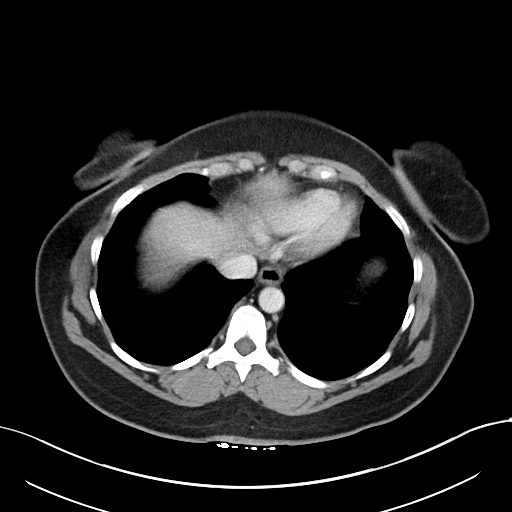
[im 91/98  soft-tissue]
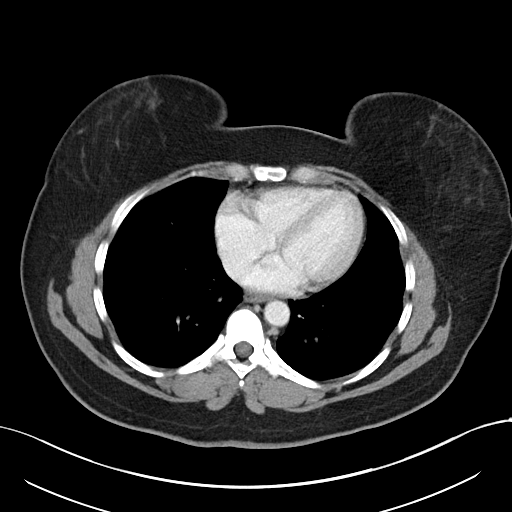

[Series 5: abdomen 3.0 mpr cor · coronal · 0.79mm/px · 3 of 102 slices shown]
[im 34/102  soft-tissue]
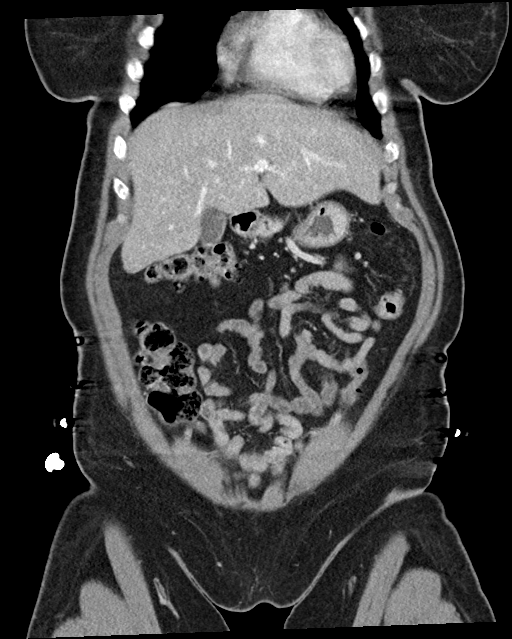
[im 45/102  soft-tissue]
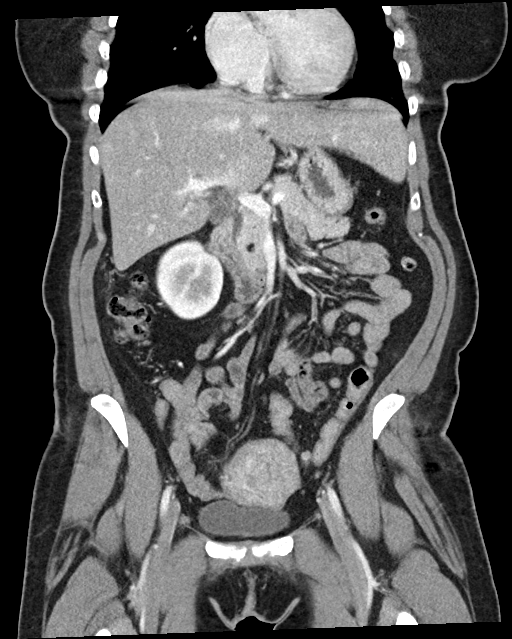
[im 57/102  soft-tissue]
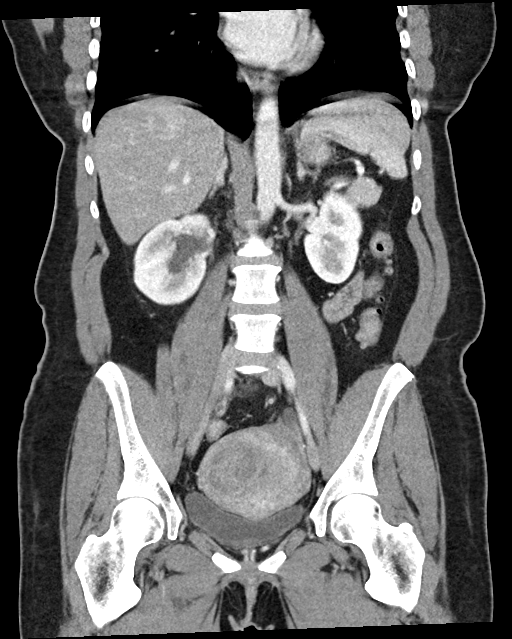

[16 of 46 positions shown; findings below may reference images not displayed]

RADIATION DOSE REDUCTION: This exam was performed according to the
departmental dose-optimization program which includes automated
exposure control, adjustment of the mA and/or kV according to
patient size and/or use of iterative reconstruction technique.

CONTRAST:  100mL OMNIPAQUE IOHEXOL 300 MG/ML  SOLN
FINDINGS: Lower chest: No acute abnormality.

Hepatobiliary: Liver is normal in size and contour with no
suspicious mass identified. Cholelithiasis without gallbladder wall
thickening or pericholecystic edema. No biliary ductal dilatation
visualized.

Pancreas: Unremarkable. No pancreatic ductal dilatation or
surrounding inflammatory changes.

Spleen: Normal in size without focal abnormality.

Adrenals/Urinary Tract: Adrenal glands are normal. 5 mm obstructing
calculus in the mid right ureter, level of S1. Moderate right
hydroureteronephrosis. A few subcentimeter hypodense likely renal
cortical cysts bilaterally. No nephrolithiasis identified. Urinary
bladder appears normal.

Stomach/Bowel: No bowel obstruction, free air or pneumatosis. No
bowel wall edema identified. Colonic diverticulosis. Appendix is
normal.

Vascular/Lymphatic: No significant vascular findings are present. No
enlarged abdominal or pelvic lymph nodes.

Reproductive: Heterogeneous mildly lobulated uterus consistent with
fibroids. No suspicious adnexal mass identified.

Other: No ascites.

Musculoskeletal: No suspicious bony lesions identified.
IMPRESSION: 1. 5 mm obstructing calculus in the mid right ureter. Moderate right
hydroureteronephrosis.
2. Colonic diverticulosis.
3. Cholelithiasis.
4. Fibroid uterus.
